# Patient Record
Sex: Female | Born: 1935 | Race: White | Hispanic: No | Smoking: Never smoker
Health system: Southern US, Community
[De-identification: ages and names within clinical notes are randomized; demographics above are authoritative.]

## PROBLEM LIST (undated history)

## (undated) DIAGNOSIS — I10 Essential (primary) hypertension: Secondary | ICD-10-CM

## (undated) DIAGNOSIS — E039 Hypothyroidism, unspecified: Secondary | ICD-10-CM

## (undated) DIAGNOSIS — J45909 Unspecified asthma, uncomplicated: Secondary | ICD-10-CM

## (undated) DIAGNOSIS — I639 Cerebral infarction, unspecified: Secondary | ICD-10-CM

## (undated) DIAGNOSIS — J189 Pneumonia, unspecified organism: Secondary | ICD-10-CM

## (undated) DIAGNOSIS — K219 Gastro-esophageal reflux disease without esophagitis: Secondary | ICD-10-CM

## (undated) HISTORY — PX: COLONOSCOPY: SHX174

## (undated) HISTORY — PX: TUBAL LIGATION: SHX77

## (undated) HISTORY — DX: Cerebral infarction, unspecified: I63.9

## (undated) HISTORY — PX: OTHER SURGICAL HISTORY: SHX169

## (undated) HISTORY — DX: Gastro-esophageal reflux disease without esophagitis: K21.9

## (undated) HISTORY — PX: VEIN LIGATION AND STRIPPING: SHX2653

## (undated) HISTORY — DX: Hypothyroidism, unspecified: E03.9

## (undated) HISTORY — PX: CATARACT EXTRACTION: SUR2

## (undated) HISTORY — PX: BREAST BIOPSY: SHX20

## (undated) HISTORY — PX: HERNIA REPAIR: SHX51

## (undated) HISTORY — PX: EYE SURGERY: SHX253

---

## 2015-06-28 ENCOUNTER — Emergency Department (HOSPITAL_COMMUNITY): Payer: Medicare Other

## 2015-06-28 ENCOUNTER — Encounter (HOSPITAL_COMMUNITY): Payer: Self-pay | Admitting: Nurse Practitioner

## 2015-06-28 ENCOUNTER — Emergency Department (HOSPITAL_COMMUNITY)
Admission: EM | Admit: 2015-06-28 | Discharge: 2015-06-28 | Disposition: A | Discharge status: Home / Self Care | Payer: Medicare Other | Attending: Emergency Medicine | Admitting: Emergency Medicine

## 2015-06-28 DIAGNOSIS — Z7982 Long term (current) use of aspirin: Secondary | ICD-10-CM | POA: Insufficient documentation

## 2015-06-28 DIAGNOSIS — J441 Chronic obstructive pulmonary disease with (acute) exacerbation: Secondary | ICD-10-CM | POA: Diagnosis not present

## 2015-06-28 DIAGNOSIS — R0602 Shortness of breath: Secondary | ICD-10-CM

## 2015-06-28 DIAGNOSIS — Z880 Allergy status to penicillin: Secondary | ICD-10-CM | POA: Diagnosis not present

## 2015-06-28 DIAGNOSIS — I10 Essential (primary) hypertension: Secondary | ICD-10-CM | POA: Diagnosis not present

## 2015-06-28 DIAGNOSIS — I712 Thoracic aortic aneurysm, without rupture, unspecified: Secondary | ICD-10-CM

## 2015-06-28 DIAGNOSIS — Z8701 Personal history of pneumonia (recurrent): Secondary | ICD-10-CM | POA: Diagnosis not present

## 2015-06-28 DIAGNOSIS — Z79899 Other long term (current) drug therapy: Secondary | ICD-10-CM | POA: Diagnosis not present

## 2015-06-28 DIAGNOSIS — Z7951 Long term (current) use of inhaled steroids: Secondary | ICD-10-CM | POA: Diagnosis not present

## 2015-06-28 DIAGNOSIS — J449 Chronic obstructive pulmonary disease, unspecified: Secondary | ICD-10-CM

## 2015-06-28 HISTORY — DX: Essential (primary) hypertension: I10

## 2015-06-28 HISTORY — DX: Unspecified asthma, uncomplicated: J45.909

## 2015-06-28 HISTORY — DX: Pneumonia, unspecified organism: J18.9

## 2015-06-28 LAB — CBC
HEMATOCRIT: 35.3 % — AB (ref 36.0–46.0)
Hemoglobin: 11.6 g/dL — ABNORMAL LOW (ref 12.0–15.0)
MCH: 28 pg (ref 26.0–34.0)
MCHC: 32.9 g/dL (ref 30.0–36.0)
MCV: 85.1 fL (ref 78.0–100.0)
PLATELETS: 418 10*3/uL — AB (ref 150–400)
RBC: 4.15 MIL/uL (ref 3.87–5.11)
RDW: 12.9 % (ref 11.5–15.5)
WBC: 8.2 10*3/uL (ref 4.0–10.5)

## 2015-06-28 LAB — BASIC METABOLIC PANEL
Anion gap: 11 (ref 5–15)
BUN: 11 mg/dL (ref 6–20)
CO2: 21 mmol/L — ABNORMAL LOW (ref 22–32)
Calcium: 8.9 mg/dL (ref 8.9–10.3)
Chloride: 103 mmol/L (ref 101–111)
Creatinine, Ser: 0.89 mg/dL (ref 0.44–1.00)
GFR calc Af Amer: >60 mL/min (ref >60)
GFR calc non Af Amer: 60 mL/min — ABNORMAL LOW (ref >60)
Glucose, Bld: 113 mg/dL — ABNORMAL HIGH (ref 65–99)
POTASSIUM: 4.1 mmol/L (ref 3.5–5.1)
SODIUM: 135 mmol/L (ref 135–145)

## 2015-06-28 LAB — URINALYSIS, ROUTINE W REFLEX MICROSCOPIC
Glucose, UA: NEGATIVE mg/dL
HGB URINE DIPSTICK: NEGATIVE
KETONES UR: 15 mg/dL — AB
Nitrite: NEGATIVE
PROTEIN: NEGATIVE mg/dL
Specific Gravity, Urine: 1.021 (ref 1.005–1.030)
pH: 6 (ref 5.0–8.0)

## 2015-06-28 LAB — I-STAT TROPONIN, ED: TROPONIN I, POC: 0 ng/mL (ref 0.00–0.08)

## 2015-06-28 LAB — URINE MICROSCOPIC-ADD ON

## 2015-06-28 LAB — D-DIMER, QUANTITATIVE (NOT AT ARMC): D DIMER QUANT: 1.68 ug{FEU}/mL — AB (ref 0.00–0.50)

## 2015-06-28 MED ORDER — PREDNISONE 10 MG PO TABS: 40.0000 mg | ORAL_TABLET | Freq: Every day | ORAL | Status: DC

## 2015-06-28 MED ORDER — IOPAMIDOL (ISOVUE-370) INJECTION 76%
INTRAVENOUS | Status: AC
  Administered 2015-06-28: 100 mL
  Filled 2015-06-28: qty 100

## 2015-06-28 NOTE — ED Provider Notes (Signed)
CSN: [id number]     Arrival date & time 06/28/15  1822 History   First MD Initiated Contact with Patient 06/28/15 2014     Chief Complaint  Patient presents with  . Shortness of Breath     (Consider location/radiation/quality/duration/timing/severity/associated sxs/prior Treatment) HPI 82 year old female who presents for shortness of breath. She has a history of hypertension and COPD not on home oxygen. History provided by patient and her son who states that she recently was diagnosed with community-acquired pneumonia and treated with 10 day course of Levaquin which she completed a week ago. No new fevers, cough, but she has been feeling more short of breath over the past day especially when laying flat. Says that she was without her inhaler one day, but was using her inhaler today, with some minimal relief. States chronic left greater than right lower extremity edema, but this is not changed from baseline. No chest pain but states it is very difficult to take a deep breath in. No dysuria, urinary frequency, fever, lightheadedness or syncope, nausea or vomiting, diarrhea, or abdominal pain.   Past Medical History  Diagnosis Date  . Hypertension   . Pneumonia   . Asthma    No past surgical history on file. No family history on file. Social History  Substance Use Topics  . Smoking status: Never Smoker   . Smokeless tobacco: None  . Alcohol Use: No   OB History    No data available     Review of Systems 10/14 systems reviewed and are negative other than those stated in the HPI   Allergies  Amoxicillin and Flagyl  Home Medications   Prior to Admission medications   Medication Sig Start Date End Date Taking? Authorizing Provider  aspirin 325 MG EC tablet Take 325 mg by mouth daily.   Yes Historical Provider, MD  Fluticasone-Salmeterol (ADVAIR) 100-50 MCG/DOSE AEPB Inhale 2 puffs into the lungs 2 (two) times daily.   Yes Historical Provider, MD  levothyroxine (SYNTHROID,  LEVOTHROID) 50 MCG tablet Take 50-100 mcg by mouth See admin instructions. Take one tablet by mouth every day except of sundays take two tablets   Yes Historical Provider, MD  metoprolol (LOPRESSOR) 50 MG tablet Take 50 mg by mouth 2 (two) times daily.   Yes Historical Provider, MD  omeprazole (PRILOSEC) 40 MG capsule Take 40 mg by mouth daily.   Yes Historical Provider, MD  Polyethyl Glycol-Propyl Glycol (SYSTANE FREE OP) Place 1 drop into both eyes daily.   Yes Historical Provider, MD  predniSONE (DELTASONE) 10 MG tablet Take 4 tablets (40 mg total) by mouth daily. 06/28/15   Lavera Guiseana Duo Denee Boeder, MD   BP 115/71 mmHg  Pulse 74  Temp(Src) 98 F (36.7 C) (Oral)  Resp 21  SpO2 97% Physical Exam Physical Exam  Nursing note and vitals reviewed. Constitutional: elderly woman, non-toxic, and in no acute distress Head: Normocephalic and atraumatic.  Mouth/Throat: Oropharynx is clear and moist.  Neck: Normal range of motion. Neck supple.  Cardiovascular: Normal rate and regular rhythm.  Trace left pedal edema. Pulmonary/Chest: Effort normal and breath sounds normal.  Abdominal: Soft. There is no tenderness. There is no rebound and no guarding.  Musculoskeletal: Normal range of motion.  Neurological: Alert, no facial droop, fluent speech, moves all extremities symmetrically Skin: Skin is warm and dry.  Psychiatric: Cooperative  ED Course  Procedures (including critical care time) Labs Review Labs Reviewed  BASIC METABOLIC PANEL - Abnormal; Notable for the following:    CO2  21 (*)    Glucose, Bld 113 (*)    GFR calc non Af Amer 60 (*)    All other components within normal limits  CBC - Abnormal; Notable for the following:    Hemoglobin 11.6 (*)    HCT 35.3 (*)    Platelets 418 (*)    All other components within normal limits  URINALYSIS, ROUTINE W REFLEX MICROSCOPIC (NOT AT [facility]) - Abnormal; Notable for the following:    Bilirubin Urine SMALL (*)    Ketones, ur 15 (*)    Leukocytes, UA  TRACE (*)    All other components within normal limits  D-DIMER, QUANTITATIVE (NOT AT [facility]) - Abnormal; Notable for the following:    D-Dimer, Quant 1.68 (*)    All other components within normal limits  URINE MICROSCOPIC-ADD ON - Abnormal; Notable for the following:    Squamous Epithelial / LPF 6-30 (*)    Bacteria, UA FEW (*)    All other components within normal limits  URINE CULTURE  I-STAT TROPOININ, ED    Imaging Review Dg Chest 2 View  06/28/2015  CLINICAL DATA:  82 year old female with shortness of breath EXAM: CHEST  2 VIEW COMPARISON:  None. FINDINGS: The heart size and mediastinal contours are within normal limits. Both lungs are clear. The visualized skeletal structures are unremarkable. IMPRESSION: No active cardiopulmonary disease. Electronically Signed   By: Elgie Collard M.D.   On: 06/28/2015 19:57   Ct Angio Chest Pe W/cm &/or Wo Cm  06/28/2015  CLINICAL DATA:  82 year old female with shortness of breath EXAM: CT ANGIOGRAPHY CHEST WITH CONTRAST TECHNIQUE: Multidetector CT imaging of the chest was performed using the standard protocol during bolus administration of intravenous contrast. Multiplanar CT image reconstructions and MIPs were obtained to evaluate the vascular anatomy. CONTRAST:  One hundred cc Isovue 370 COMPARISON:  Chest radiograph dated 06/28/2015 FINDINGS: Minimal bibasilar atelectasis/ scarring noted. There is a 3 cm pneumatocele at the right lung base. The lungs are otherwise clear. There is no pleural effusion or pneumothorax. Small right upper lobe calcified granuloma. The central airways are patent. There is mild atherosclerotic calcification of the aorta. There is mild dilatation of the ascending aorta measuring up to 2.7 cm 4.3 cm in diameter. No CT evidence of pulmonary embolism. Top-normal cardiac size. There is coronary vascular calcification. No pericardial effusion. Top-normal right hilar lymph node. There is no mediastinal adenopathy. The esophagus and  the thyroid gland are grossly unremarkable. There is no axillary adenopathy. The chest wall soft tissues appear unremarkable. There is degenerative changes of the spine. No acute fracture. There is an ill-defined 6.0 x 7.0 cm hypodense lesion in the left lobe of the liver. MRI without and with contrast is recommended for further characterization. There is asymmetric prominence of the left renal parenchyma this may be related to orientation of the kidney or congenital. A left renal lesion is not excluded. Further evaluation of the left kidney recommended on the MRI. There is a 1.4 cm indeterminate right adrenal nodule, possibly an adenoma. Review of the MIP images confirms the above findings. IMPRESSION: No CT evidence of pulmonary embolism. A 4.3 cm aneurysmal dilatation of the ascending aorta. Recommend annual imaging followup by CTA or MRA. This recommendation follows 2010 ACCF/AHA/AATS/ACR/ASA/SCA/SCAI/SIR/STS/SVM Guidelines for the Diagnosis and Management of Patients with Thoracic Aortic Disease. Circulation. 2010; 121: W098-J191 Left hepatic mass. Further evaluation with MRI without and with contrast is recommended. Electronically Signed   By: Elgie Collard M.D.   On:  06/28/2015 23:10   I have personally reviewed and evaluated these images and lab results as part of my medical decision-making.   EKG Interpretation   Date/Time:  Thursday Jun 28 2015 18:30:03 EDT Ventricular Rate:  98 PR Interval:  166 QRS Duration: 84 QT Interval:  342 QTC Calculation: 436 R Axis:   0 Text Interpretation:  Sinus rhythm with frequent Premature ventricular  complexes Low voltage QRS Cannot rule out Anterior infarct , age  undetermined Abnormal ECG No prior EKG for comparison  Confirmed by Marquavius Scaife  MD, Annabelle Harman [phone]) on 06/28/2015 8:11:37 PM      MDM   Final diagnoses:  Shortness of breath  Chronic obstructive pulmonary disease, unspecified COPD type (HCC)  Thoracic aortic aneurysm without rupture (HCC)     82 year old female with history of COPD and hypertension who presents with shortness of breath over the past day in the setting of recent pneumonia. She is well-appearing in no acute distress. Vital signs are non-concerning. She appears to be breathing comfortably on room air with normal oxygenation and no conversational dyspnea. Lungs are clear to auscultation. CXR showing no acute cardiopulmonary processes. EKG without acute ischemic changes or heart strain. With mild LLE edema, but states that is chronic. Otherwise, overall not fluid overloaded and not suggestive of new onset CHF. Although comfortable, states subjectively sob. Ddimer sent given significant home immobilization in setting of recent PNA. CT PE negative for PE, infiltrate, edema or other serious etiology of her symptoms. There is TAA, unlikely causing her symptoms. Discussed with patient and given cardiothoracic surgery follow-up. Question possible mild COPD exacerbation the setting of her recent pneumonia with increased inhaler usage at home. Given normal breathing and well appearance, appropriate for discharge home. Will give trial of steroids. Son also states concern for recent debility after pneumonia at home. Will place home health order for evaluation of PT and home health aid. Strict return and follow-up instructions reviewed. They expressed understanding of all discharge instructions and felt comfortable with the plan of care.   Lavera Guise, MD 06/28/15 [phone]

## 2015-06-28 NOTE — Discharge Instructions (Signed)
Your chest CT does not show serious cause of your shortness of breath today. Please follow-up with your PCP very closely for re-evaluation. Since you have been using your inhaler frequently after your infection and you are concerned about COPD flare up, you are given a short course of steroids to see if your symptoms improve.   You are given follow-up, above, for following your thoracic aortic aneurysm, but that is not what is causing your symptoms today.   Please return for worsening symptoms, including fever, confusion, worsening breath, severe chest pain/back pain, or any other symptoms concerning to you.  Chronic Obstructive Pulmonary Disease  Chronic obstructive pulmonary disease (COPD) is a common lung condition in which airflow from the lungs is limited. COPD is a general term that can be used to describe many different lung problems that limit airflow, including chronic bronchitis and emphysema. COPD exacerbations are episodes when breathing symptoms become much worse and require extra treatment. Without treatment, COPD exacerbations can be life threatening, and frequent COPD exacerbations can cause further damage to your lungs. CAUSES  Respiratory infections.  Exposure to smoke.  Exposure to air pollution, chemical fumes, or dust. Sometimes there is no apparent cause or trigger. RISK FACTORS  Smoking cigarettes.  Older age.  Frequent prior COPD exacerbations. SIGNS AND SYMPTOMS  Increased coughing.  Increased thick spit (sputum) production.  Increased wheezing.  Increased shortness of breath.  Rapid breathing.  Chest tightness. DIAGNOSIS Your medical history, a physical exam, and tests will help your health care provider make a diagnosis. Tests may include:  A chest X-ray.  Basic lab tests.  Sputum testing.  An arterial blood gas test. TREATMENT Depending on the severity of your COPD exacerbation, you may need to be admitted to a hospital for treatment. Some of  the treatments commonly used to treat COPD exacerbations are:   Antibiotic medicines.  Bronchodilators. These are drugs that expand the air passages. They may be given with an inhaler or nebulizer. Spacer devices may be needed to help improve drug delivery.  Corticosteroid medicines.  Supplemental oxygen therapy.  Airway clearing techniques, such as noninvasive ventilation (NIV) and positive expiratory pressure (PEP). These provide respiratory support through a mask or other noninvasive device. HOME CARE INSTRUCTIONS  Do not smoke. Quitting smoking is very important to prevent COPD from getting worse and exacerbations from happening as often.  Avoid exposure to all substances that irritate the airway, especially to tobacco smoke.  If you were prescribed an antibiotic medicine, finish it all even if you start to feel better.  Take all medicines as directed by your health care provider.It is important to use correct technique with inhaled medicines.  Drink enough fluids to keep your urine clear or pale yellow (unless you have a medical condition that requires fluid restriction).  Use a cool mist vaporizer. This makes it easier to clear your chest when you cough.  If you have a home nebulizer and oxygen, continue to use them as directed.  Maintain all necessary vaccinations to prevent infections.  Exercise regularly.  Eat a healthy diet.  Keep all follow-up appointments as directed by your health care provider. SEEK IMMEDIATE MEDICAL CARE IF:  You have worsening shortness of breath.  You have trouble talking.  You have severe chest pain.  You have blood in your sputum.  You have a fever.  You have weakness, vomit repeatedly, or faint.  You feel confused.  You continue to get worse. MAKE SURE YOU:  Understand these instructions.  Will watch your condition.  Will get help right away if you are not doing well or get worse.   This information is not intended to  replace advice given to you by your health care provider. Make sure you discuss any questions you have with your health care provider.   Document Released: 12/01/2006 Document Revised: 02/24/2014 Document Reviewed: 10/08/2012 Elsevier Interactive Patient Education 2016 [employer]  Thoracic Aortic Aneurysm An aneurysm is a bulge in an artery. It happens when the wall of the artery is weakened or damaged. If the aneurysm gets too big, it bursts (ruptures) and severe bleeding occurs. A thoracic aortic aneurysm is an aneurysm that occurs in the first part of the aorta, between the heart and the diaphragm. The aorta is the main artery and supplies blood from the heart to the rest of the body. A thoracic aortic aneurysm can enlarge and rupture or blood can flow between the layers of the wall of the aorta through a tear (aorticdissection). Both of these conditions can cause bleeding inside the body and can be life threatening unless diagnosed and treated promptly. CAUSES  The exact cause of a thoracic aortic aneurysm is often unknown. Some contributing factors are:   A hardening of the arteries caused by the buildup of fat and other substances in the lining of a blood vessel (arteriosclerosis).  Inflammation of the walls of an artery (arteritis).  Connective tissue diseases, such as Marfan syndrome.  Injury or trauma to the aorta.  An infection, such as syphilis or staphylococcus, in the wall of the aorta (infectious aortitis) caused by bacteria. RISK FACTORS  Risk factors that contribute to a thoracic aortic aneurysm may include:  Age older than 60 years.  High blood pressure (hypertension).  Female gender.  Ethnicity (white race).  Obesity.  Family history of aneurysm (first degree relatives only).  Tobacco use. PREVENTION  The following healthy lifestyle habits may help decrease your risk of a thoracic aortic aneurysm:  Quitting smoking. Smoking can raise your blood pressure and  cause arteriosclerosis.  Limiting or avoiding alcohol.  Keeping your blood pressure, blood sugar level, and cholesterol levels within normal limits.  Decreasing your salt intake. In some people, too much salt can raise blood pressure and increase your risk of abdominal aortic aneurysm.  Eating a diet low in saturated fats and cholesterol.  Increasing your fiber intake by including whole grains, vegetables, and fruits in your diet. Eating these foods may help lower blood pressure.  Maintaining a healthy weight.  Staying physically active and exercising regularly. SYMPTOMS  The symptoms of thoracic aortic aneurysm may vary depending on the size and rate of growth of the aneurysm. Most grow slowly and do not have any symptoms. When symptoms do occur, they may include:  Pain (chest, back, sides, or abdomen). The pain may vary in intensity. A sudden onset of severe pain may indicate that the aneurysm has ruptured.  Hoarseness.  Cough.  Shortness of breath.  Swallowing problems.  Nausea or vomiting or both. DIAGNOSIS  Since most unruptured thoracic aortic aneurysms have no symptoms, they are often discovered during diagnostic exams for other conditions. An aneurysm may be found during the following procedures:  Ultrasonography (a one-time screening for thoracic aortic aneurysm by ultrasonography is also recommended for all men aged 65-75 years who have ever smoked).  X-ray exams.  A CT scan.  An MRI.  Angiography or arteriography. TREATMENT  Treatment of a thoracic aortic aneurysm depends on the size of your  aneurysm, your age, and risk factors for rupture. Medicine to control blood pressure and pain may be used to manage aneurysms smaller than 2.3 in (6 cm). Regular monitoring for enlargement may be recommended by your health care provider if:  The aneurysm is 1.2-1.5 in (3-4 cm) in size (an annual ultrasonography may be recommended).  The aneurysm is 1.5-1.8 in (4-4.5 cm) in  size (an ultrasonography every 6 months may be recommended).  The aneurysm is larger than 1.8 in (4.5 cm) in size (your health care provider may ask that you be examined by a vascular surgeon). If your aneurysm is larger than 2.2 in (5.5 cm) or if it is enlarging quickly, surgical repair may be recommended. There are two main methods for repair of an aneurysm:   Endovascular repair (a minimally invasive surgery).  Open repair. This method is used if an endovascular repair is not possible.   This information is not intended to replace advice given to you by your health care provider. Make sure you discuss any questions you have with your health care provider.   Document Released: 02/03/2005 Document Revised: 11/24/2012 Document Reviewed: 08/16/2012 Elsevier Interactive Patient Education 2016 [employer] Inc[employer]  Shortness of Breath Shortness of breath means you have trouble breathing. Shortness of breath needs medical care right away. HOME CARE   Do not smoke.  Avoid being around chemicals or things (paint fumes, dust) that may bother your breathing.  Rest as needed. Slowly begin your normal activities.  Only take medicines as told by your doctor.  Keep all doctor visits as told. GET HELP RIGHT AWAY IF:   Your shortness of breath gets worse.  You feel lightheaded, pass out (faint), or have a cough that is not helped by medicine.  You cough up blood.  You have pain with breathing.  You have pain in your chest, arms, shoulders, or belly (abdomen).  You have a fever.  You cannot walk up stairs or exercise the way you normally do.  You do not get better in the time expected.  You have a hard time doing normal activities even with rest.  You have problems with your medicines.  You have any new symptoms. MAKE SURE YOU:  Understand these instructions.  Will watch your condition.  Will get help right away if you are not doing well or get worse.   This information is not  intended to replace advice given to you by your health care provider. Make sure you discuss any questions you have with your health care provider.   Document Released: 07/23/2007 Document Revised: 02/08/2013 Document Reviewed: 04/21/2011 Elsevier Interactive Patient Education [employer]

## 2015-06-28 NOTE — ED Notes (Signed)
Pt transported to CT ?

## 2015-06-28 NOTE — ED Notes (Signed)
Pt c/o feeling sob since last night. SOB is worse when lying flat. She denies pain, fevers, cough, she reports recent treatment for pneumonia with oral abx at home. She is alert and in no acute distress

## 2015-06-30 LAB — URINE CULTURE: Culture: NO GROWTH

## 2015-07-17 ENCOUNTER — Institutional Professional Consult (permissible substitution) (INDEPENDENT_AMBULATORY_CARE_PROVIDER_SITE_OTHER): Payer: Medicare Other | Admitting: Thoracic Surgery (Cardiothoracic Vascular Surgery)

## 2015-07-17 ENCOUNTER — Encounter: Payer: Self-pay | Admitting: Thoracic Surgery (Cardiothoracic Vascular Surgery)

## 2015-07-17 ENCOUNTER — Other Ambulatory Visit: Payer: Self-pay | Admitting: *Deleted

## 2015-07-17 VITALS — BP 148/86 | HR 64 | Resp 20 | Ht 66.0 in | Wt 145.0 lb

## 2015-07-17 DIAGNOSIS — R160 Hepatomegaly, not elsewhere classified: Secondary | ICD-10-CM

## 2015-07-17 DIAGNOSIS — I7121 Aneurysm of the ascending aorta, without rupture: Secondary | ICD-10-CM | POA: Insufficient documentation

## 2015-07-17 DIAGNOSIS — I712 Thoracic aortic aneurysm, without rupture: Secondary | ICD-10-CM | POA: Insufficient documentation

## 2015-07-17 DIAGNOSIS — K6389 Other specified diseases of intestine: Secondary | ICD-10-CM

## 2015-07-17 NOTE — Progress Notes (Signed)
PCP is PROVIDER NOT IN SYSTEM Referring Provider is Verdie MosherLiu, Neysa Bonitoana Duo, MD  Chief Complaint  Patient presents with  . Thoracic Aortic Aneurysm    Surgical eval on 4.3 cm aneurysmal dilatation of the ascending aorta seen on CTA Chest 06/28/2015 at [facility]    HPI: 82 [facility] [place] [facility] [place] who is sent for consultation regarding an ascending aneurysm.  Cindy Little is an 82 year old woman with a past medical history significant for tobacco abuse (quit in 2009), COPD, hypertension, stroke in 2009, hypothyroidism, Lyme disease, and pneumonia. She was recently treated for pneumonia. She presented to the emergency room on 06/28/2015 with worsening shortness of breath. As part of her workup she had a CT of the chest with contrast using the PE protocol. There was no evidence of pulmonary embolus. The radiologist reported a 4.3 cm ascending aneurysm and also a 7 cm hepatic mass. Cindy Little is aware of the "aneurysm", but did not know of the possible liver mass.  She recently has been expressing shortness of breath with exertion, loss of appetite, and decreased energy with pneumonia. Those are improving to some degree. She is very anxious about the diagnosis of aneurysm. She complains of reflux and difficulty swallowing. She does not have any chest pain pressure or tightness.    Past Medical History  Diagnosis Date  . Hypertension   . Pneumonia   . Asthma   . Hypothyroidism   . GERD (gastroesophageal reflux disease)   . CVA (cerebral infarction)       Past Surgical History  Procedure Laterality Date  . Colonoscopy    . Eye surgery    . Cataract extraction    . Hernia repair    . Foot/toes surgery    . Breast biopsy    . Tubal ligation    . Vein ligation and stripping      Family History  Problem Relation Age of Onset  . Heart disease Mother   . Heart disease Father   . Hypertension Father     Social History Social History  Substance Use Topics  . Smoking status: Never  Smoker   . Smokeless tobacco: None  . Alcohol Use: No    Current Outpatient Prescriptions  Medication Sig Dispense Refill  . aspirin 325 MG EC tablet Take 325 mg by mouth daily.    . Fluticasone-Salmeterol (ADVAIR) 100-50 MCG/DOSE AEPB Inhale 2 puffs into the lungs 2 (two) times daily.    Marland Kitchen. levothyroxine (SYNTHROID, LEVOTHROID) 50 MCG tablet Take 50-100 mcg by mouth See admin instructions. Take one tablet by mouth every day except of sundays take two tablets    . metoprolol (LOPRESSOR) 50 MG tablet Take 50 mg by mouth 2 (two) times daily.    Marland Kitchen. omeprazole (PRILOSEC) 40 MG capsule Take 40 mg by mouth daily.    Bertram Gala. Polyethyl Glycol-Propyl Glycol (SYSTANE FREE OP) Place 1 drop into both eyes daily.     No current facility-administered medications for this visit.    Allergies  Allergen Reactions  . Amoxicillin     Rash   . Flagyl [Metronidazole]     Hives     Review of Systems  Constitutional: Positive for fever (With recent pneumonia), activity change and appetite change. Negative for unexpected weight change.  HENT: Positive for hearing loss and trouble swallowing. Negative for voice change.   Respiratory: Positive for cough, shortness of breath and wheezing.   Cardiovascular: Positive for leg swelling. Negative for chest pain.  Gastrointestinal: Positive  for abdominal pain (Reflux).  Genitourinary: Negative for hematuria and difficulty urinating.  Musculoskeletal: Positive for arthralgias. Negative for myalgias.  Neurological: Negative for seizures and syncope.  Hematological: Negative for adenopathy. Bruises/bleeds easily.  All other systems reviewed and are negative.   BP 148/86 mmHg  Pulse 64  Resp 20  Ht  (1.676 m)  Wt 145 lb (65.772 kg)  BMI 23.41 kg/m2  SpO2 94% Physical Exam  Constitutional: She is oriented to person, place, and time. No distress.  Elderly  HENT:  Head: Normocephalic and atraumatic.  Eyes: Conjunctivae and EOM are normal. No scleral icterus.   Neck: Neck supple. No thyromegaly present.  Cardiovascular: Normal rate, regular rhythm and normal heart sounds.  Exam reveals no gallop and no friction rub.   No murmur heard. Pulmonary/Chest: Effort normal and breath sounds normal. No respiratory distress. She has no wheezes. She has no rales.  Abdominal: Soft. She exhibits no distension. There is no tenderness.  Musculoskeletal: She exhibits edema (1+ bilaterally).  Kyphoscoliosis  Lymphadenopathy:    She has no cervical adenopathy.  Neurological: She is alert and oriented to person, place, and time. No cranial nerve deficit. She exhibits normal muscle tone.  Skin: Skin is warm and dry.  Vitals reviewed.    Diagnostic Tests: CT ANGIOGRAPHY CHEST WITH CONTRAST  TECHNIQUE: Multidetector CT imaging of the chest was performed using the standard protocol during bolus administration of intravenous contrast. Multiplanar CT image reconstructions and MIPs were obtained to evaluate the vascular anatomy.  CONTRAST: One hundred cc Isovue 370  COMPARISON: Chest radiograph dated 06/28/2015  FINDINGS: Minimal bibasilar atelectasis/ scarring noted. There is a 3 cm pneumatocele at the right lung base. The lungs are otherwise clear. There is no pleural effusion or pneumothorax. Small right upper lobe calcified granuloma. The central airways are patent.  There is mild atherosclerotic calcification of the aorta. There is mild dilatation of the ascending aorta measuring up to 2.7 cm 4.3 cm in diameter. No CT evidence of pulmonary embolism. Top-normal cardiac size. There is coronary vascular calcification. No pericardial effusion. Top-normal right hilar lymph node. There is no mediastinal adenopathy. The esophagus and the thyroid gland are grossly unremarkable.  There is no axillary adenopathy. The chest wall soft tissues appear unremarkable. There is degenerative changes of the spine. No acute fracture.  There is an ill-defined  6.0 x 7.0 cm hypodense lesion in the left lobe of the liver. MRI without and with contrast is recommended for further characterization. There is asymmetric prominence of the left renal parenchyma this may be related to orientation of the kidney or congenital. A left renal lesion is not excluded. Further evaluation of the left kidney recommended on the MRI. There is a 1.4 cm indeterminate right adrenal nodule, possibly an adenoma.  Review of the MIP images confirms the above findings.  IMPRESSION: No CT evidence of pulmonary embolism.  A 4.3 cm aneurysmal dilatation of the ascending aorta. Recommend annual imaging followup by CTA or MRA. This recommendation follows 2010 ACCF/AHA/AATS/ACR/ASA/SCA/SCAI/SIR/STS/SVM Guidelines for the Diagnosis and Management of Patients with Thoracic Aortic Disease. Circulation. 2010; 121: Z610-R604  Left hepatic mass. Further evaluation with MRI without and with contrast is recommended.   Electronically Signed  By: Elgie Collard M.D.  On: 06/28/2015 23:10  I personally reviewed the CT chest and concur with the findings as noted above with the exception of I do not believe there is a 4.3 cm aneurysm. When viewed on the coronal and sagittal views the 4.3 cm  measurement clearly comes in the area of significant curvature of the aorta and is not a true cross-sectional area. I think the true cross-sectional area is between 3.6 and 3.8 cm.  Impression: 82 year old woman with a history of hypertension who recently was evaluated in the emergency room for shortness of breath. A CT done to rule out pulmonary emboli showed a questionable 4.3 cm ascending aortic aneurysm. I carefully reviewed the films and it is patently clear that the 4.3 cm measurement was taken in an area of curvature of the aorta and is not a true cross-sectional area. When combining the axial coronal and sagittal views to try to get a true diameter and appears to be more on the order of  3.6 or possibly as high as 3.8.  I recommended that we get another CT in a year just to be on the safe side. Her aorta is large if not aneurysmal. Even if it was 4.3 cm, the recommendation would still be the repeat a CT in the year.  Of more immediate concern is her possible liver mass. This is about 7 cm and the left lobe of the liver. I discussed this finding with the patient and her son. They were quite surprised as they had not heard this before. She is not have a history of drinking or cirrhosis.  I recommended that we go ahead and schedule her for the MR with and without contrast to better characterize the liver lesion. It could be something as simple as a hemangioma. I offered to help her set that up in [place] if she would prefer, but she would rather do it here.  I'm also going to schedule her for a consultation with Dr. Donell Beers to expedite the process in case this turns out to be a mass that requires further inquiry.  Plan: MR of the abdomen with and without contrast to evaluate possible liver mass.  Referral to Dr. Donell Beers.  Return in one year with CT angio of chest  Loreli Slot, MD [facility] [phone]

## 2015-07-20 ENCOUNTER — Ambulatory Visit
Admission: RE | Admit: 2015-07-20 | Discharge: 2015-07-20 | Disposition: A | Discharge status: Home / Self Care | Payer: Medicare Other | Source: Ambulatory Visit | Attending: Thoracic Surgery (Cardiothoracic Vascular Surgery) | Admitting: Thoracic Surgery (Cardiothoracic Vascular Surgery)

## 2015-07-20 DIAGNOSIS — K6389 Other specified diseases of intestine: Secondary | ICD-10-CM

## 2015-07-20 MED ORDER — GADOBENATE DIMEGLUMINE 529 MG/ML IV SOLN
13.0000 mL | Freq: Once | INTRAVENOUS | Status: AC | PRN
  Administered 2015-07-20: 13 mL via INTRAVENOUS

## 2015-07-26 ENCOUNTER — Telehealth: Payer: Self-pay | Admitting: Thoracic Surgery (Cardiothoracic Vascular Surgery)

## 2015-07-26 NOTE — Telephone Encounter (Signed)
Called [name] with MR results  I informed her of the MR showing 2 liver masses.  She understands that this needs to be evaluated ASAP. It could possibly be malignant.  She cancelled her appointment with Dr. Donell BeersByerly due to transportation issues  She has an appointment with [place]. [place]

## 2015-07-27 ENCOUNTER — Telehealth: Payer: Self-pay | Admitting: *Deleted

## 2015-07-27 NOTE — Telephone Encounter (Signed)
Ms. Cindy Little called to request that Dr. Dorris FetchHendrickson refer her to Dr. Limmie PatriciaMarriett Rubio, a liver specialist in [place]. She had called their office for an appointment but was told she would need a referral from our office.  I faxed all records to their office. Ms. Cindy Little has the CD of her MRI to take to her appointment. Office fax# [phone]. She has chosen to see a Care[facility] advisersurgeon in [place] instead of Dr. Donell BeersByerly here in [place] regarding her liver lesions.

## 2016-06-05 ENCOUNTER — Other Ambulatory Visit: Payer: Self-pay | Admitting: Thoracic Surgery (Cardiothoracic Vascular Surgery)

## 2016-06-05 DIAGNOSIS — I7121 Aneurysm of the ascending aorta, without rupture: Secondary | ICD-10-CM

## 2016-06-05 DIAGNOSIS — I712 Thoracic aortic aneurysm, without rupture: Secondary | ICD-10-CM

## 2016-07-15 ENCOUNTER — Ambulatory Visit: Payer: Medicare Other | Admitting: Thoracic Surgery (Cardiothoracic Vascular Surgery)

## 2016-07-15 ENCOUNTER — Other Ambulatory Visit: Payer: Medicare Other

## 2017-12-06 IMAGING — CT CT ANGIO CHEST
2 of 6 series · 18 of 36 positions shown · IV contrast (Omni 300)
Comparison: Chest radiograph dated 06/28/2015

CLINICAL DATA: 82-year-old female with shortness of breath

EXAM:
CT ANGIOGRAPHY CHEST WITH CONTRAST
TECHNIQUE: Multidetector CT imaging of the chest was performed using the
standard protocol during bolus administration of intravenous
contrast. Multiplanar CT image reconstructions and MIPs were
obtained to evaluate the vascular anatomy.
CONTRAST:  One hundred cc Isovue 370

[Series 7: pe thins · axial · 0.64mm/px · z∈[+747,+995]mm · 17 of 551 slices shown]
[im 27/551  lung]
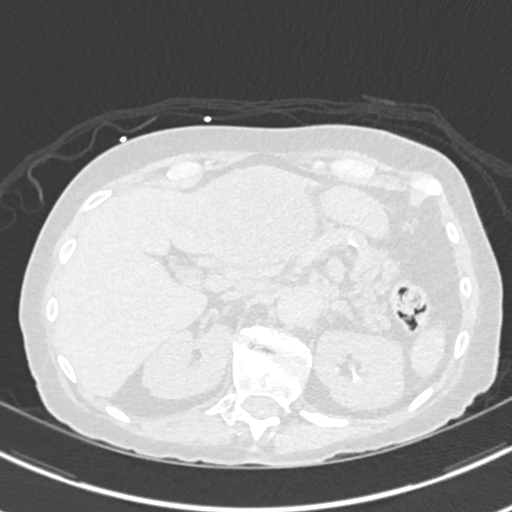
[im 53/551  mediastinal]
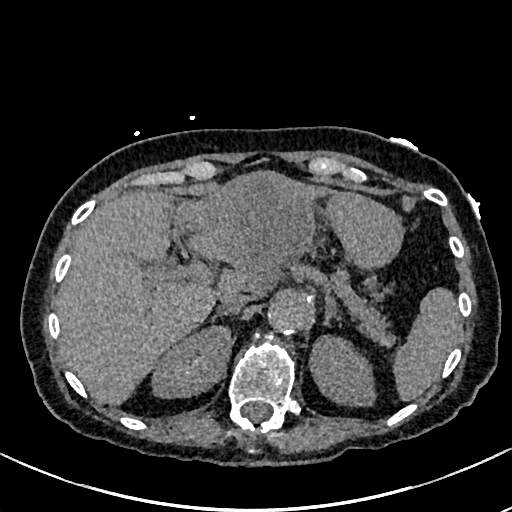
[im 79/551  lung]
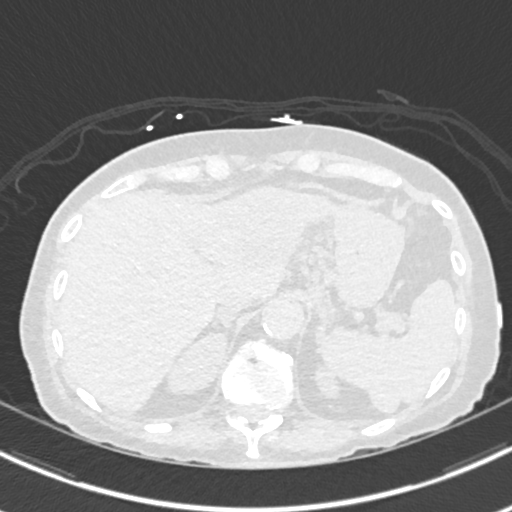
[im 131/551  mediastinal]
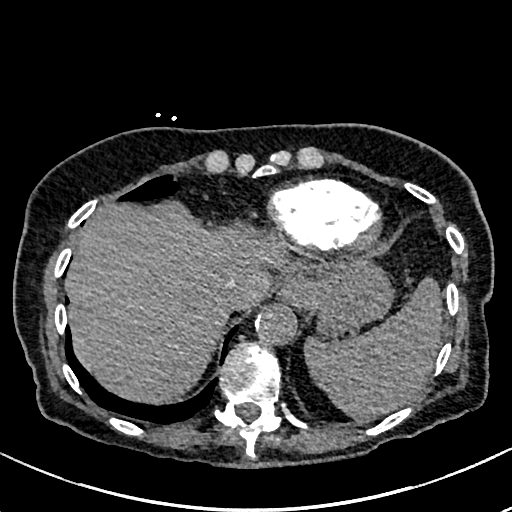
[im 158/551  lung]
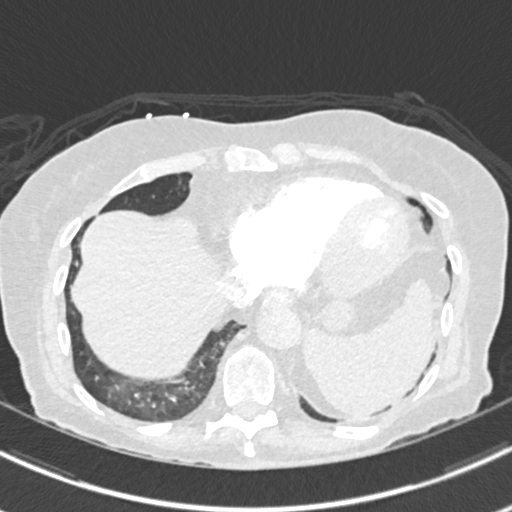
[im 184/551  mediastinal]
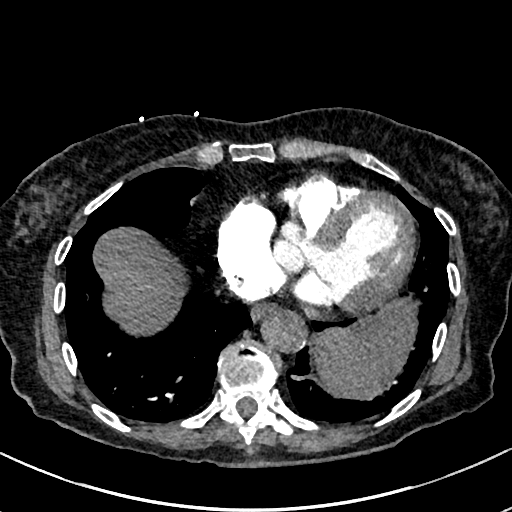
[im 210/551  lung]
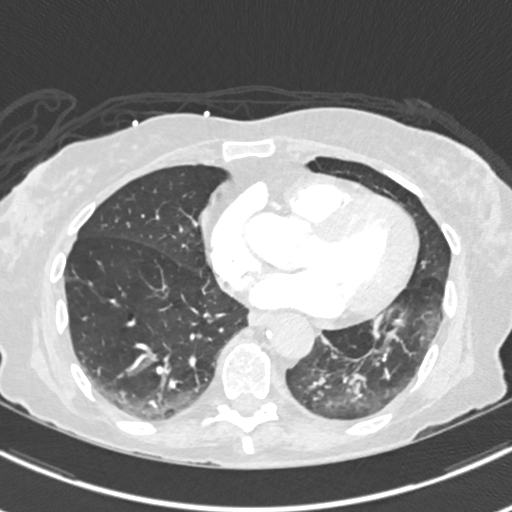
[im 236/551  mediastinal]
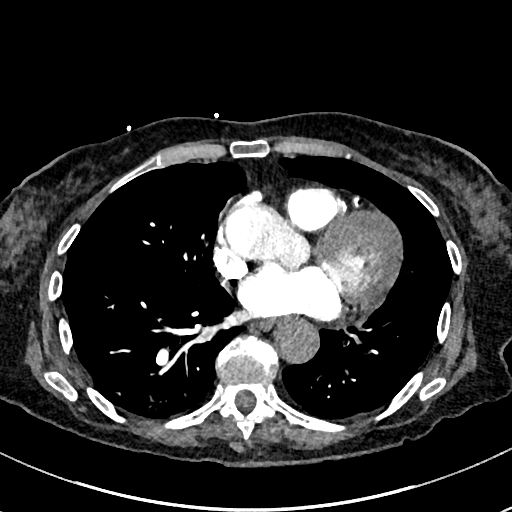
[im 289/551  lung]
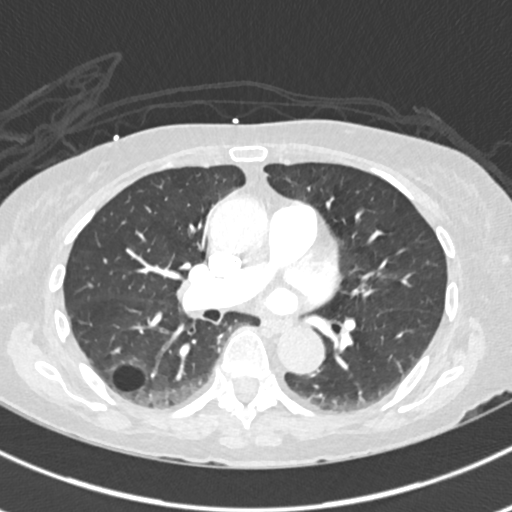
[im 315/551  mediastinal]
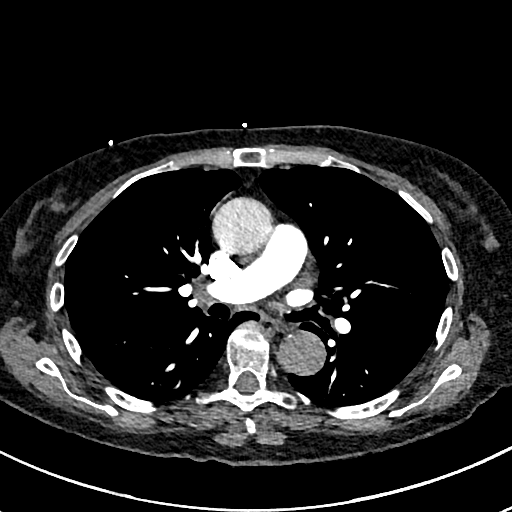
[im 341/551  lung]
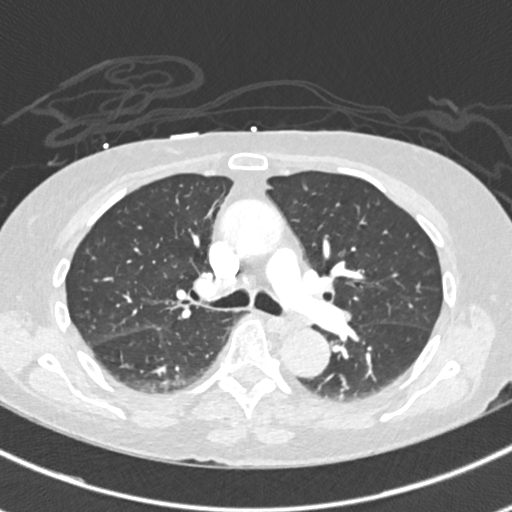
[im 367/551  mediastinal]
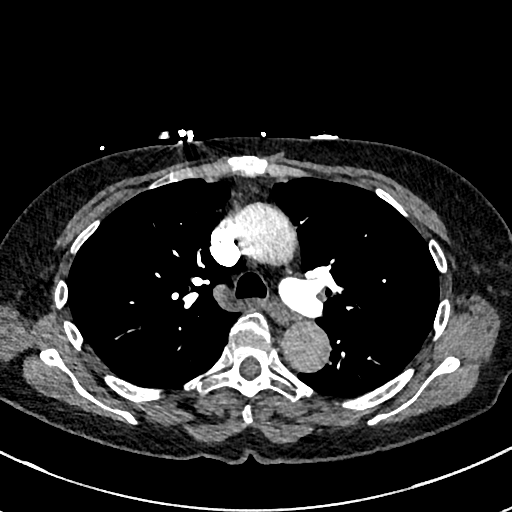
[im 393/551  lung]
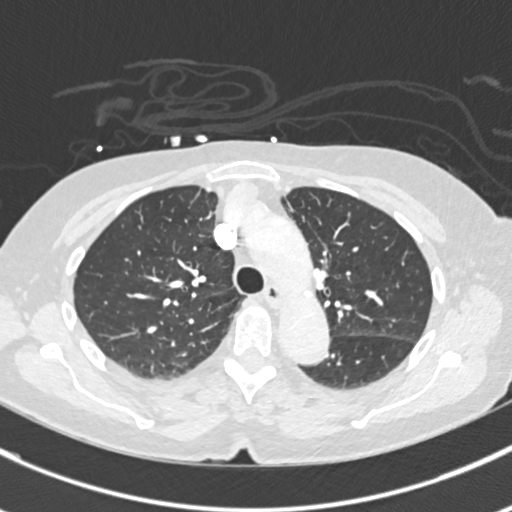
[im 420/551  mediastinal]
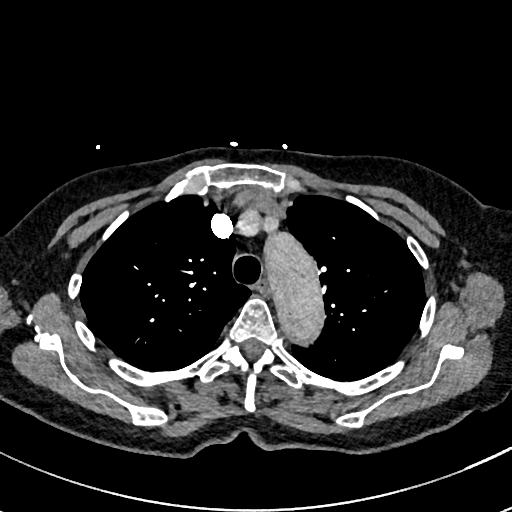
[im 472/551  lung]
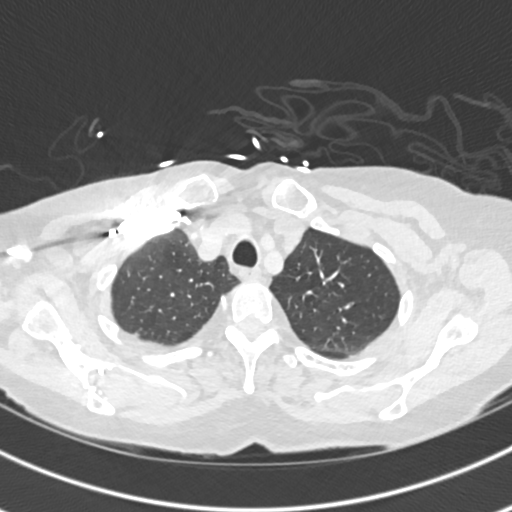
[im 498/551  mediastinal]
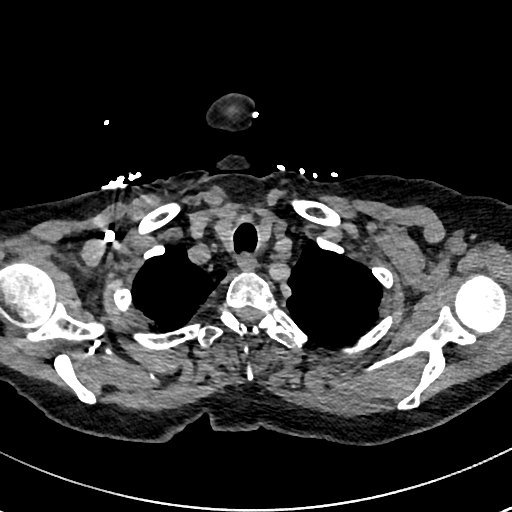
[im 524/551  lung]
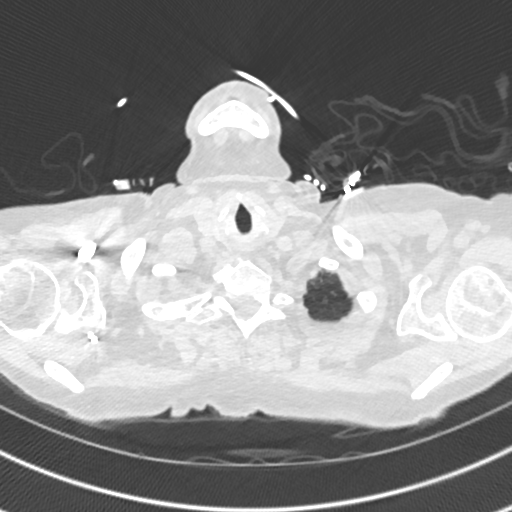

[Series 8: pe 2mm cor · coronal · 0.59mm/px · 1 of 108 slices shown]
[im 54/108  mediastinal]
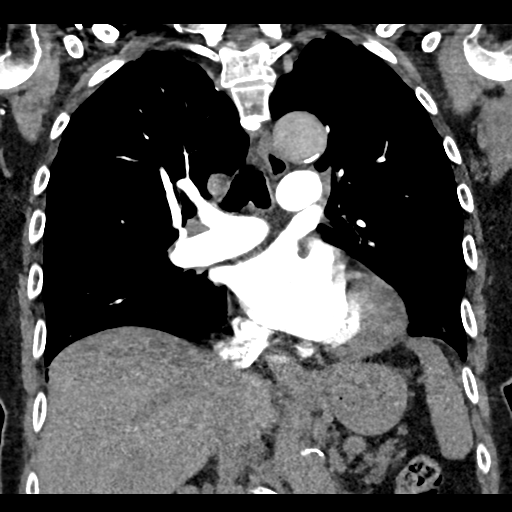

[18 of 36 positions shown; findings below may reference images not displayed]

FINDINGS: Minimal bibasilar atelectasis/ scarring noted. There is a 3 cm
pneumatocele at the right lung base. The lungs are otherwise clear.
There is no pleural effusion or pneumothorax. Small right upper lobe
calcified granuloma. The central airways are patent.

There is mild atherosclerotic calcification of the aorta. There is
mild dilatation of the ascending aorta measuring up to 2.7 cm 4.3 cm
in diameter. No CT evidence of pulmonary embolism. Top-normal
cardiac size. There is coronary vascular calcification. No
pericardial effusion. Top-normal right hilar lymph node. There is no
mediastinal adenopathy. The esophagus and the thyroid gland are
grossly unremarkable.

There is no axillary adenopathy. The chest wall soft tissues appear
unremarkable. There is degenerative changes of the spine. No acute
fracture.

There is an ill-defined 6.0 x 7.0 cm hypodense lesion in the left
lobe of the liver. MRI without and with contrast is recommended for
further characterization. There is asymmetric prominence of the left
renal parenchyma this may be related to orientation of the kidney or
congenital. A left renal lesion is not excluded. Further evaluation
of the left kidney recommended on the MRI. There is a 1.4 cm
indeterminate right adrenal nodule, possibly an adenoma.

Review of the MIP images confirms the above findings.
IMPRESSION: No CT evidence of pulmonary embolism.

A 4.3 cm aneurysmal dilatation of the ascending aorta. Recommend
annual imaging followup by CTA or MRA. This recommendation follows
6898 ACCF/AHA/AATS/ACR/ASA/SCA/BA/ALBIN/TULIO/MARK Guidelines for the
Diagnosis and Management of Patients with Thoracic Aortic Disease.
Circulation. 6898; 121: [id number]

Left hepatic mass. Further evaluation with MRI without and with
contrast is recommended.
